# Patient Record
Sex: Female | Born: 1950 | Race: White | Hispanic: No | Smoking: Never smoker
Health system: Southern US, Community
[De-identification: ages and names within clinical notes are randomized; demographics above are authoritative.]

## PROBLEM LIST (undated history)

## (undated) DIAGNOSIS — E785 Hyperlipidemia, unspecified: Secondary | ICD-10-CM

## (undated) DIAGNOSIS — Z9484 Stem cells transplant status: Secondary | ICD-10-CM

## (undated) DIAGNOSIS — M654 Radial styloid tenosynovitis [de Quervain]: Secondary | ICD-10-CM

## (undated) DIAGNOSIS — I219 Acute myocardial infarction, unspecified: Secondary | ICD-10-CM

## (undated) DIAGNOSIS — K219 Gastro-esophageal reflux disease without esophagitis: Secondary | ICD-10-CM

## (undated) DIAGNOSIS — I2510 Atherosclerotic heart disease of native coronary artery without angina pectoris: Secondary | ICD-10-CM

## (undated) DIAGNOSIS — K5790 Diverticulosis of intestine, part unspecified, without perforation or abscess without bleeding: Secondary | ICD-10-CM

## (undated) DIAGNOSIS — Z9481 Bone marrow transplant status: Secondary | ICD-10-CM

## (undated) DIAGNOSIS — C9200 Acute myeloblastic leukemia, not having achieved remission: Secondary | ICD-10-CM

## (undated) DIAGNOSIS — N179 Acute kidney failure, unspecified: Secondary | ICD-10-CM

## (undated) DIAGNOSIS — F32A Depression, unspecified: Secondary | ICD-10-CM

## (undated) HISTORY — PX: TUBAL LIGATION: SHX77

## (undated) HISTORY — DX: Hyperlipidemia, unspecified: E78.5

## (undated) HISTORY — DX: Acute kidney failure, unspecified: N17.9

## (undated) HISTORY — DX: Diverticulosis of intestine, part unspecified, without perforation or abscess without bleeding: K57.90

## (undated) HISTORY — DX: Depression, unspecified: F32.A

## (undated) HISTORY — DX: Acute myeloblastic leukemia, not having achieved remission: C92.00

## (undated) HISTORY — PX: CARDIAC SURGERY: SHX584

## (undated) HISTORY — DX: Acute myocardial infarction, unspecified: I21.9

## (undated) HISTORY — DX: Gastro-esophageal reflux disease without esophagitis: K21.9

## (undated) HISTORY — DX: Stem cells transplant status: Z94.84

## (undated) HISTORY — DX: Atherosclerotic heart disease of native coronary artery without angina pectoris: I25.10

## (undated) HISTORY — DX: Radial styloid tenosynovitis (de quervain): M65.4

## (undated) HISTORY — PX: EYE SURGERY: SHX253

## (undated) HISTORY — DX: Bone marrow transplant status: Z94.81

---

## 2017-11-10 MED ORDER — CITALOPRAM HYDROBROMIDE 20 MG PO TABS
20.00 | ORAL_TABLET | ORAL | Status: DC
Start: 2017-11-09 — End: 2017-11-10

## 2017-11-10 MED ORDER — ACETAMINOPHEN 325 MG PO TABS
650.00 | ORAL_TABLET | ORAL | Status: DC
Start: ? — End: 2017-11-10

## 2017-11-10 MED ORDER — FLUTICASONE PROPIONATE 50 MCG/ACT NA SUSP
2.00 | NASAL | Status: DC
Start: 2017-11-09 — End: 2017-11-10

## 2017-11-10 MED ORDER — PANTOPRAZOLE SODIUM 40 MG PO TBEC
40.00 | DELAYED_RELEASE_TABLET | ORAL | Status: DC
Start: 2017-11-09 — End: 2017-11-10

## 2017-11-10 MED ORDER — LISINOPRIL 5 MG PO TABS
5.00 | ORAL_TABLET | ORAL | Status: DC
Start: 2017-11-09 — End: 2017-11-10

## 2017-11-10 MED ORDER — FEXOFENADINE HCL 180 MG PO TABS
180.00 | ORAL_TABLET | ORAL | Status: DC
Start: 2017-11-09 — End: 2017-11-10

## 2017-11-10 MED ORDER — GENERIC EXTERNAL MEDICATION
12.50 | Status: DC
Start: 2017-11-08 — End: 2017-11-10

## 2017-11-10 MED ORDER — ASPIRIN EC 81 MG PO TBEC
81.00 | DELAYED_RELEASE_TABLET | ORAL | Status: DC
Start: 2017-11-09 — End: 2017-11-10

## 2017-11-10 MED ORDER — MELATONIN 3 MG PO TABS
3.00 | ORAL_TABLET | ORAL | Status: DC
Start: 2017-11-08 — End: 2017-11-10

## 2017-11-10 MED ORDER — LIDOCAINE HCL (PF) 1 % IJ SOLN
0.50 | INTRAMUSCULAR | Status: DC
Start: ? — End: 2017-11-10

## 2017-11-10 MED ORDER — ATORVASTATIN CALCIUM 20 MG PO TABS
20.00 | ORAL_TABLET | ORAL | Status: DC
Start: 2017-11-08 — End: 2017-11-10

## 2017-11-10 MED ORDER — PRASUGREL HCL 10 MG PO TABS
10.00 | ORAL_TABLET | ORAL | Status: DC
Start: 2017-11-09 — End: 2017-11-10

## 2019-03-04 ENCOUNTER — Other Ambulatory Visit: Payer: Self-pay | Admitting: Family Medicine

## 2019-03-04 DIAGNOSIS — Z1231 Encounter for screening mammogram for malignant neoplasm of breast: Secondary | ICD-10-CM

## 2019-03-28 ENCOUNTER — Other Ambulatory Visit: Payer: Self-pay

## 2019-03-28 ENCOUNTER — Ambulatory Visit
Admission: RE | Admit: 2019-03-28 | Discharge: 2019-03-28 | Disposition: A | Discharge status: Home / Self Care | Payer: Medicare Other | Source: Ambulatory Visit | Attending: Family Medicine | Admitting: Family Medicine

## 2019-03-28 DIAGNOSIS — Z1231 Encounter for screening mammogram for malignant neoplasm of breast: Secondary | ICD-10-CM | POA: Insufficient documentation

## 2020-05-07 ENCOUNTER — Other Ambulatory Visit: Payer: Self-pay

## 2020-05-07 DIAGNOSIS — Z1231 Encounter for screening mammogram for malignant neoplasm of breast: Secondary | ICD-10-CM

## 2020-05-25 ENCOUNTER — Encounter (INDEPENDENT_AMBULATORY_CARE_PROVIDER_SITE_OTHER): Payer: Self-pay

## 2020-05-25 ENCOUNTER — Other Ambulatory Visit: Payer: Self-pay

## 2020-05-25 ENCOUNTER — Ambulatory Visit
Admission: RE | Admit: 2020-05-25 | Discharge: 2020-05-25 | Disposition: A | Discharge status: Home / Self Care | Payer: Medicare Other | Source: Ambulatory Visit

## 2020-05-25 DIAGNOSIS — Z1231 Encounter for screening mammogram for malignant neoplasm of breast: Secondary | ICD-10-CM | POA: Diagnosis present

## 2021-01-14 ENCOUNTER — Other Ambulatory Visit: Payer: Self-pay | Admitting: Pediatrics

## 2021-01-14 DIAGNOSIS — Z1231 Encounter for screening mammogram for malignant neoplasm of breast: Secondary | ICD-10-CM

## 2021-01-14 DIAGNOSIS — Z780 Asymptomatic menopausal state: Secondary | ICD-10-CM

## 2021-05-26 ENCOUNTER — Ambulatory Visit
Admission: RE | Admit: 2021-05-26 | Discharge: 2021-05-26 | Disposition: A | Discharge status: Home / Self Care | Payer: Medicare Other | Source: Ambulatory Visit | Attending: Pediatrics | Admitting: Pediatrics

## 2021-05-26 ENCOUNTER — Other Ambulatory Visit: Payer: Self-pay

## 2021-05-26 DIAGNOSIS — Z1231 Encounter for screening mammogram for malignant neoplasm of breast: Secondary | ICD-10-CM | POA: Insufficient documentation

## 2022-05-20 ENCOUNTER — Ambulatory Visit
Admission: EM | Admit: 2022-05-20 | Discharge: 2022-05-20 | Disposition: A | Discharge status: Home / Self Care | Payer: Medicare Other | Attending: Emergency Medicine | Admitting: Emergency Medicine

## 2022-05-20 ENCOUNTER — Encounter: Payer: Self-pay | Admitting: Emergency Medicine

## 2022-05-20 DIAGNOSIS — R3915 Urgency of urination: Secondary | ICD-10-CM | POA: Insufficient documentation

## 2022-05-20 LAB — URINALYSIS, ROUTINE W REFLEX MICROSCOPIC
Bilirubin Urine: NEGATIVE
Glucose, UA: 500 mg/dL — AB
Hgb urine dipstick: NEGATIVE
Ketones, ur: NEGATIVE mg/dL
Leukocytes,Ua: NEGATIVE
Nitrite: NEGATIVE
Protein, ur: 30 mg/dL — AB
Specific Gravity, Urine: 1.01 (ref 1.005–1.030)
pH: 5.5 (ref 5.0–8.0)

## 2022-05-20 LAB — URINALYSIS, MICROSCOPIC (REFLEX)

## 2022-05-20 LAB — GLUCOSE, CAPILLARY: Glucose-Capillary: 132 mg/dL — ABNORMAL HIGH (ref 70–99)

## 2022-05-20 MED ORDER — CEFPODOXIME PROXETIL 200 MG PO TABS: 200.0000 mg | ORAL_TABLET | Freq: Two times a day (BID) | ORAL | 0 refills | Status: AC

## 2022-05-20 NOTE — ED Provider Notes (Signed)
HPI  SUBJECTIVE:  Courtney Thompson is a 71 y.o. female who presents with 2 weeks of urinary urgency, frequency, occasional incontinence primarily at night.  No dysuria,  Cloudy or odorous urine, hematuria, fevers, abdominal, back pain.  She is not sure if she has had any body aches.  She had 1 episode of nonbilious, nonbloody emesis and nausea today immediately after eating.  She has been tolerating fluids since.  No chest pain, pressure, heaviness, nausea, diaphoresis with the nausea/vomiting.  No coughing, wheezing, shortness of breath, lower extremity edema, unintentional weight gain, PND, orthopnea over the past 2 weeks.  Patient took some Zofran and Ativan for the nausea and vomiting with improvement in her symptoms.  No aggravating factors.  Friend and patient states that they contacted her oncologist, who is concerned about a possible UTI, and wants a UA done.  She has had symptoms like this before immediately after her stem cell transplant when she had hemorrhagic cystitis that resolved on its own.  She has a past medical history of leukemia, and is status post stem cell transplant, on Prograf.  She has a history of UTI, elevated creatinine and a STEMI years ago.  No history of diabetes, hypertension, pyelonephritis, nephrolithiasis, congestive heart failure.  PCP: [facility]  Labs obtained from outside records, additional history obtained from friend who accompanies patient today.  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[place])    AKI (acute kidney injury) ([place])    Coronary artery disease    De Quervain's disease (radial styloid tenosynovitis)    Depression    Diverticulosis    GERD (gastroesophageal reflux disease)    H/O bone marrow transplant ([place])    H/O stem cell transplant ([place])    Hyperlipidemia    Myocardial infarct [facility])     Past Surgical History:  Procedure Laterality Date   CARDIAC SURGERY     EYE SURGERY     TUBAL LIGATION      Family History   Problem Relation Age of Onset   Breast cancer Neg Hx     Social History   Tobacco Use   Smoking status: Never   Smokeless tobacco: Never  Vaping Use   Vaping Use: Never used  Substance Use Topics   Alcohol use: Never   Drug use: Never    No current facility-administered medications for this encounter.  Current Outpatient Medications:    acyclovir (ZOVIRAX) 400 MG tablet, Take 400 mg by mouth 2 (two) times daily., Disp: , Rfl:    cefpodoxime (VANTIN) 200 MG tablet, Take 1 tablet (200 mg total) by mouth 2 (two) times daily for 10 days., Disp: 20 tablet, Rfl: 0   cetirizine (ZYRTEC) 10 MG tablet, Take by mouth., Disp: , Rfl:    citalopram (CELEXA) 20 MG tablet, Take by mouth., Disp: , Rfl:    empagliflozin (JARDIANCE) 10 MG TABS tablet, Take 1 tablet by mouth daily., Disp: , Rfl:    famotidine (PEPCID) 20 MG tablet, Take 1 tablet by mouth 2 (two) times daily., Disp: , Rfl:    Letermovir 480 MG TABS, Take by mouth., Disp: , Rfl:    lisinopril (ZESTRIL) 20 MG tablet, Take 20 mg by mouth daily., Disp: , Rfl:    metoprolol succinate (TOPROL-XL) 25 MG 24 hr tablet, Take 1 tablet by mouth daily., Disp: , Rfl:    Multiple Vitamins-Minerals (PRESERVISION AREDS 2) CAPS, Take by mouth., Disp: , Rfl:    ondansetron (ZOFRAN) 8 MG tablet, Take 1  tablet by mouth every 8 (eight) hours as needed., Disp: , Rfl:    posaconazole (NOXAFIL) 100 MG TBEC delayed-release tablet, Take by mouth., Disp: , Rfl:    potassium chloride SA (KLOR-CON M) 20 MEQ tablet, Take by mouth., Disp: , Rfl:    predniSONE (DELTASONE) 10 MG tablet, Take 40 mg by mouth daily., Disp: , Rfl:    ruxolitinib phosphate (JAKAFI) 5 MG tablet, Take 1 tablet by mouth 2 (two) times daily., Disp: , Rfl:    tacrolimus (PROGRAF) 0.5 MG capsule, Take by mouth., Disp: , Rfl:    traMADol (ULTRAM) 50 MG tablet, Take 50 mg by mouth every 6 (six) hours as needed., Disp: , Rfl:   Allergies  Allergen Reactions   Chlorhexidine Itching    Chlorhexidine Gluconate Itching and Rash   Statins Other (See Comments)    Muscle pain. Tolerates well in low doses     ROS  As noted in HPI.   Physical Exam  BP 110/73 (BP Location: Left Arm)   Pulse 82   Temp 97.8 F (36.6 C) (Oral)   Resp 14   Ht '4\' 11"'$  (1.499 m)   Wt 52.6 kg   SpO2 98%   BMI 23.43 kg/m   Constitutional: Well developed, well nourished, no acute distress Eyes:  EOMI, conjunctiva normal bilaterally HENT: Normocephalic, atraumatic,mucus membranes moist Respiratory: Normal inspiratory effort, lungs clear bilaterally. Cardiovascular: Normal rate, regular rhythm, no murmurs, rubs, gallops GI: nondistended soft, nontender, active bowel sounds.  No rebound, guarding Back: No CVAT skin: No rash, skin intact Musculoskeletal: No lower extremity edema Neurologic: Alert & oriented x 3, no focal neuro deficits Psychiatric: Speech and behavior appropriate   ED Course   Medications - No data to display  Orders Placed This Encounter  Procedures   Urine Culture    Standing Status:   Standing    Number of Occurrences:   1    Order Specific Question:   Indication    Answer:   Urgency/frequency   Urinalysis, Routine w reflex microscopic Urine, Clean Catch    Standing Status:   Standing    Number of Occurrences:   1   Urinalysis, Microscopic (reflex)    Standing Status:   Standing    Number of Occurrences:   1   Glucose, capillary    Standing Status:   Standing    Number of Occurrences:   1    Results for orders placed or performed during the hospital encounter of 05/20/22 (from the past 24 hour(s))  Urinalysis, Routine w reflex microscopic Urine, Clean Catch     Status: Abnormal   Collection Time: 05/20/22  4:31 PM  Result Value Ref Range   Color, Urine YELLOW YELLOW   APPearance CLEAR CLEAR   Specific Gravity, Urine 1.010 1.005 - 1.030   pH 5.5 5.0 - 8.0   Glucose, UA 500 (A) NEGATIVE mg/dL   Hgb urine dipstick NEGATIVE NEGATIVE   Bilirubin Urine  NEGATIVE NEGATIVE   Ketones, ur NEGATIVE NEGATIVE mg/dL   Protein, ur 30 (A) NEGATIVE mg/dL   Nitrite NEGATIVE NEGATIVE   Leukocytes,Ua NEGATIVE NEGATIVE  Urinalysis, Microscopic (reflex)     Status: Abnormal   Collection Time: 05/20/22  4:31 PM  Result Value Ref Range   RBC / HPF 0-5 0 - 5 RBC/hpf   WBC, UA 6-10 0 - 5 WBC/hpf   Bacteria, UA FEW (A) NONE SEEN   Squamous Epithelial / LPF 6-10 0 - 5   Hyaline Casts, UA  PRESENT   Glucose, capillary     Status: Abnormal   Collection Time: 05/20/22  5:12 PM  Result Value Ref Range   Glucose-Capillary 132 (H) 70 - 99 mg/dL   No results found.  ED Clinical Impression  1. Urinary urgency      ED Assessment/Plan     Outside labs from last week reviewed.  Calculated creatinine clearance 31 mL/min.  Do not need to renally dose cefpodoxime per up-to-date.  Friend and patient states that their PCP's primary concern is the possibility of a urinary tract infection.  There is no evidence of CHF.  Discussed with them that the urine is fairly nondiagnostic, however, given how immunocompromised she is, I am going to send this off for culture and start her on an antibiotic today.  We will treat as a complicated urinary tract infection with cefpodoxime 200 mg twice daily for 10 days.  She has follow-up with her PCP on Monday.  They will follow-up on the urine culture.  Strict ER return precautions given.  Patient and friend are agreeable with this plan.  Discussed labs,  MDM, treatment plan, and plan for follow-up with patient. Discussed sn/sx that should prompt return to the ED. patient agrees with plan.   Meds ordered this encounter  Medications   cefpodoxime (VANTIN) 200 MG tablet    Sig: Take 1 tablet (200 mg total) by mouth 2 (two) times daily for 10 days.    Dispense:  20 tablet    Refill:  0      *This clinic note was created using [employer]. Therefore, there may be occasional mistakes despite careful  proofreading.  ?    Melynda Ripple, MD 05/21/22 [phone]

## 2022-05-20 NOTE — Discharge Instructions (Addendum)
I have sent your urine off for culture, however, I am starting you on an antibiotic today to cover any possible urinary tract infection.  Please follow-up with your doctor as scheduled on Monday.  Go immediately to the ER if you start vomiting again, have fevers, body aches, feel generally weak, or for any other concerns.

## 2022-05-20 NOTE — ED Triage Notes (Signed)
Patient c/o urinary frequency that started at night for couple of weeks.  Patient denies any dysuria.  Patient denies fevers.

## 2022-05-23 ENCOUNTER — Telehealth (HOSPITAL_COMMUNITY): Payer: Self-pay | Admitting: Emergency Medicine

## 2022-05-23 LAB — URINE CULTURE: Culture: 30000 — AB

## 2022-05-23 MED ORDER — AMOXICILLIN 500 MG PO CAPS: 500.0000 mg | ORAL_CAPSULE | Freq: Three times a day (TID) | ORAL | 0 refills | Status: AC

## 2022-07-12 ENCOUNTER — Other Ambulatory Visit: Payer: Self-pay | Admitting: Hematology and Oncology

## 2022-07-12 DIAGNOSIS — Z1231 Encounter for screening mammogram for malignant neoplasm of breast: Secondary | ICD-10-CM

## 2022-07-20 ENCOUNTER — Ambulatory Visit: Payer: Medicare Other

## 2022-08-01 ENCOUNTER — Ambulatory Visit: Payer: Medicare Other

## 2023-01-16 IMAGING — MG MM DIGITAL SCREENING BILAT W/ TOMO AND CAD
8 series · 8 of 24 positions shown · non-contrast
Comparison: Previous exam(s).

CLINICAL DATA: Screening.

EXAM:
DIGITAL SCREENING BILATERAL MAMMOGRAM WITH TOMOSYNTHESIS AND CAD
TECHNIQUE: Bilateral screening digital craniocaudal and mediolateral oblique
mammograms were obtained. Bilateral screening digital breast
tomosynthesis was performed. The images were evaluated with
computer-aided detection.

[L CC synth-2D]
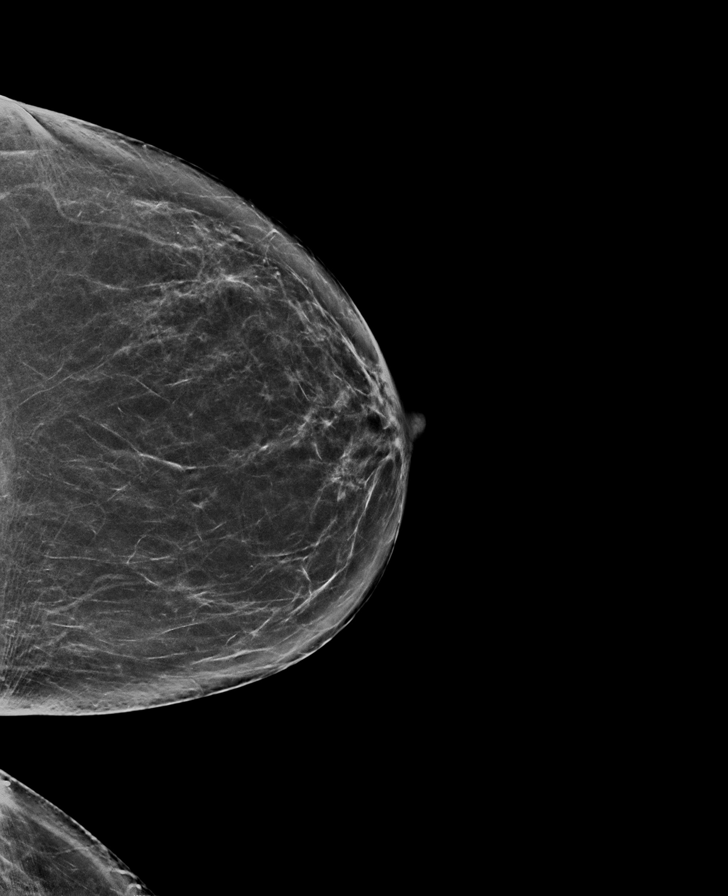

[R CC synth-2D]
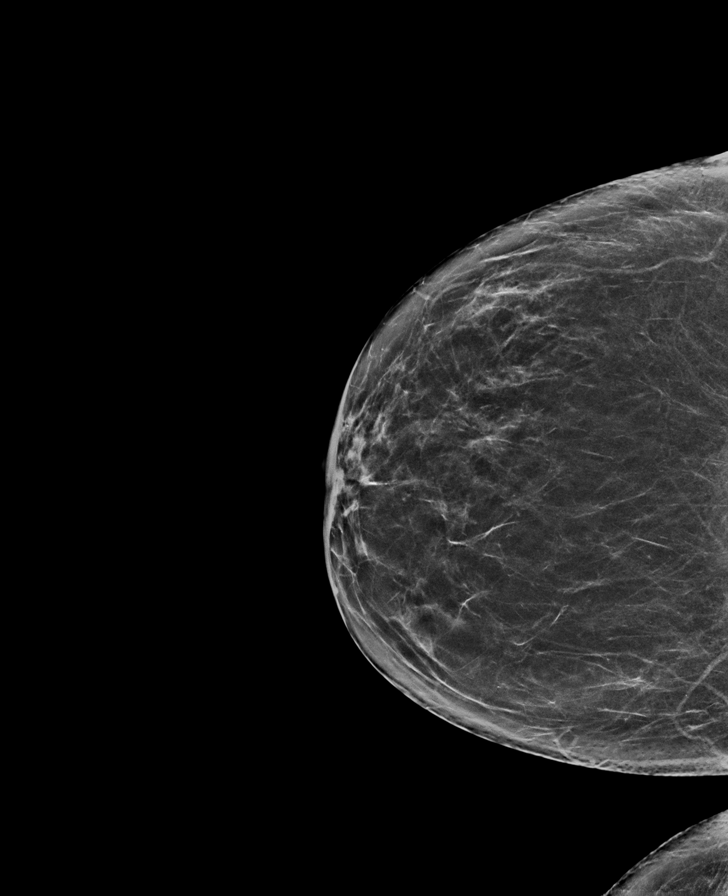

[R MLO synth-2D]
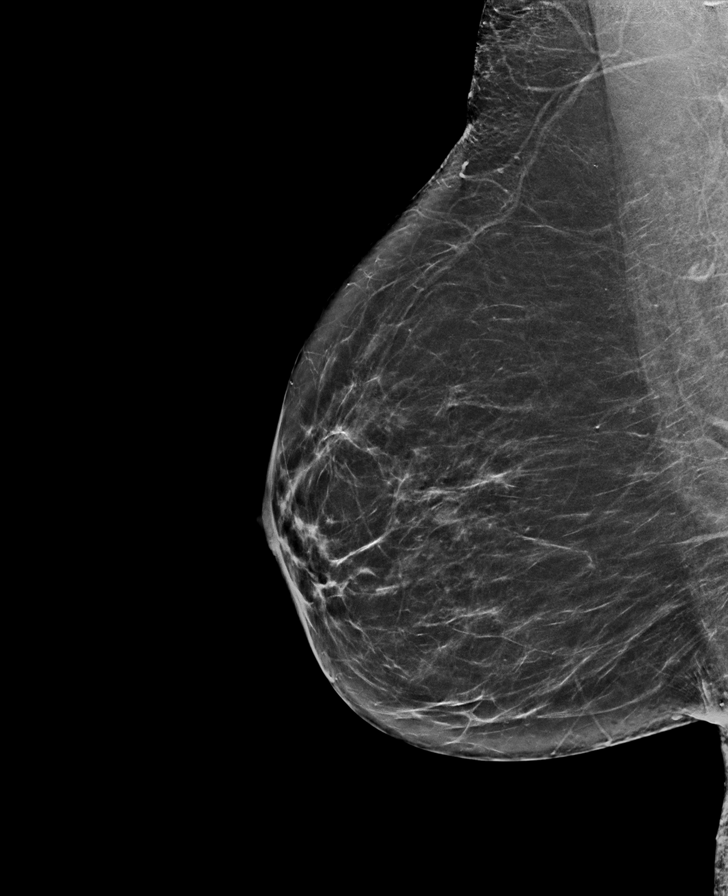

[L MLO synth-2D]
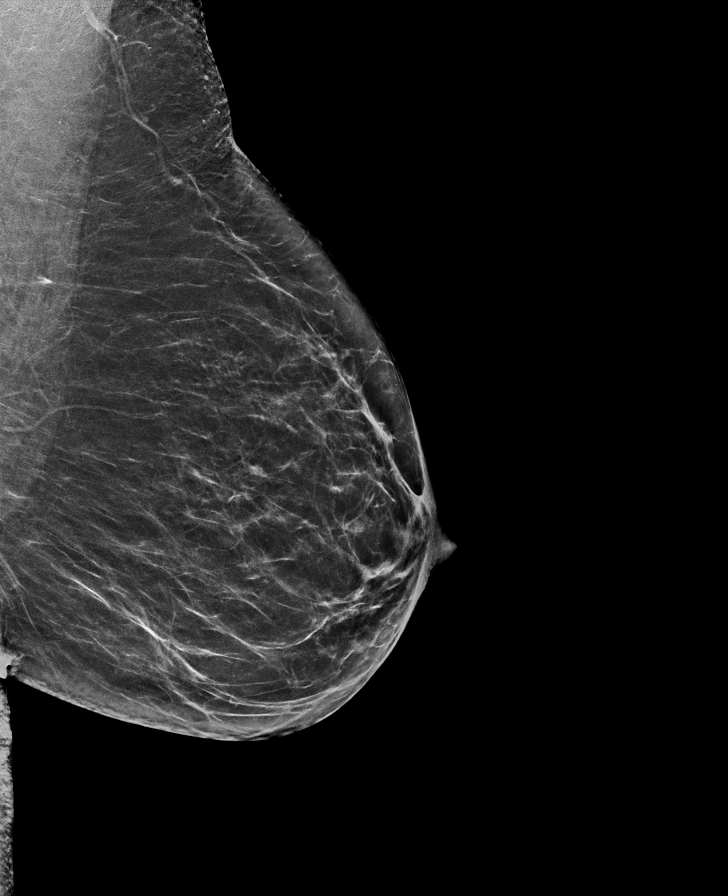

[R MLO tomo · tomo slice 35/69.0]
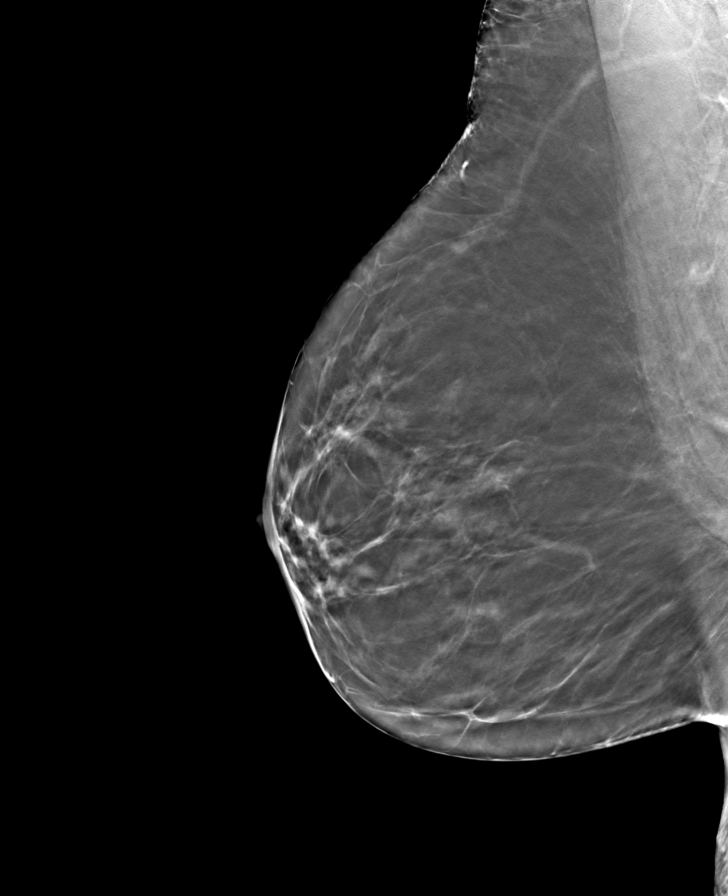

[L CC tomo · tomo slice 35/68.0]
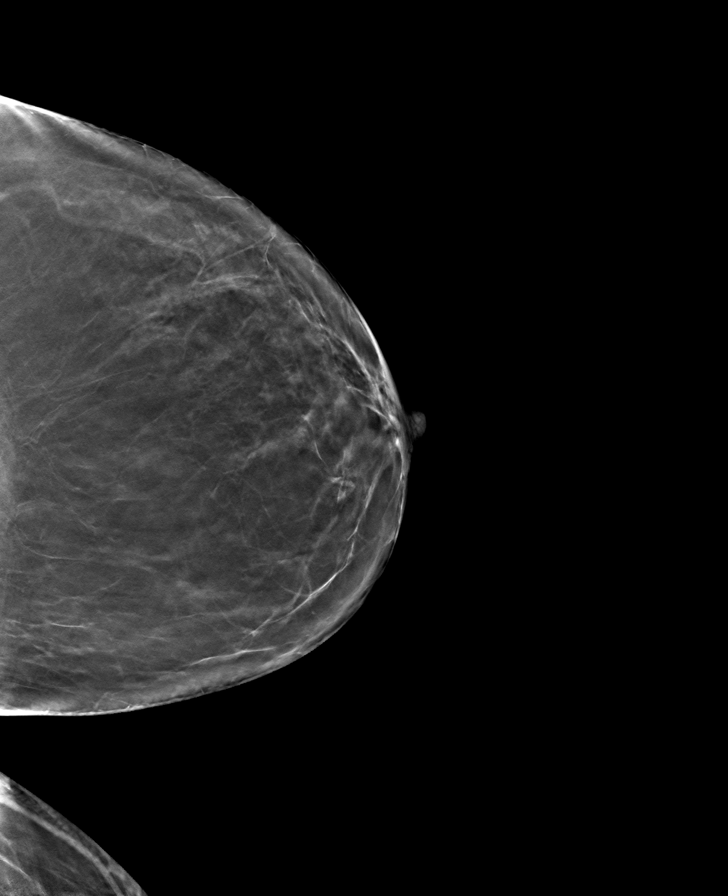

[R CC tomo · tomo slice 33/66.0]
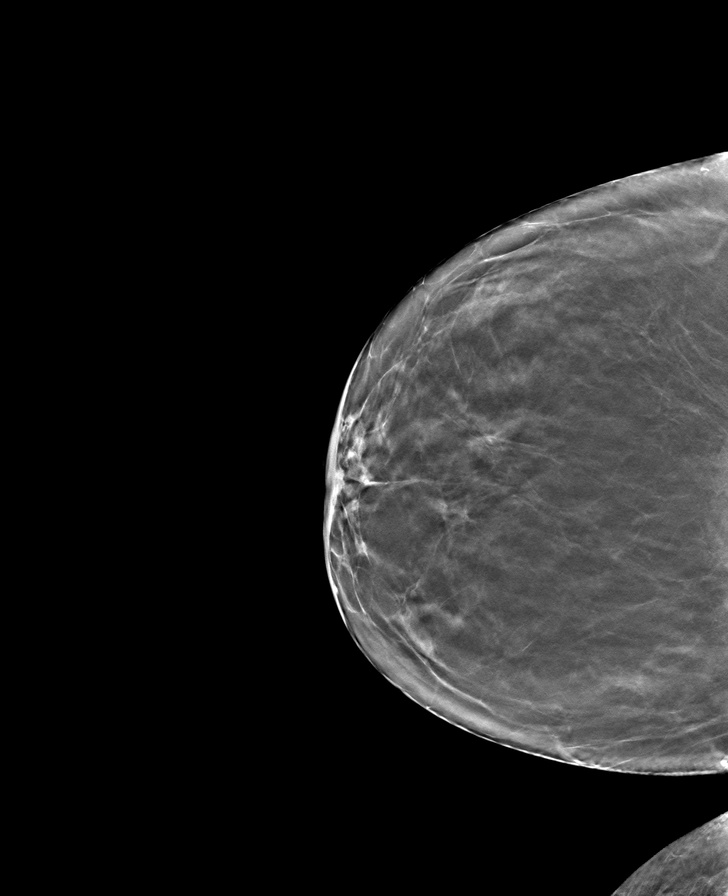

[L MLO tomo · tomo slice 35/68.0]
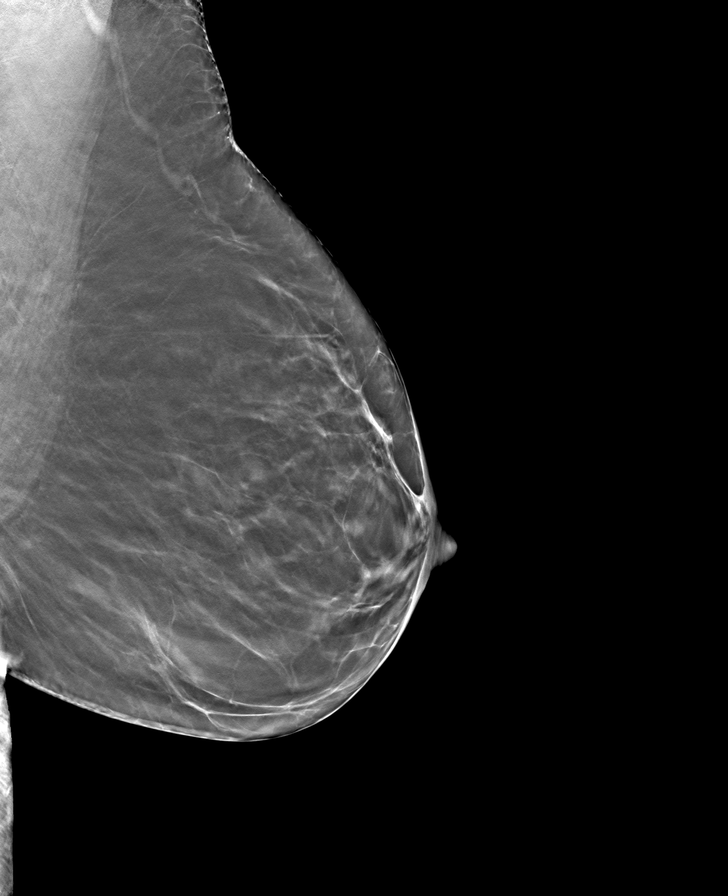

[8 of 24 positions shown; findings below may reference images not displayed]

ACR Breast Density Category b: There are scattered areas of
fibroglandular density.
FINDINGS: There are no findings suspicious for malignancy.
IMPRESSION: No mammographic evidence of malignancy. A result letter of this
screening mammogram will be mailed directly to the patient.

RECOMMENDATION:
Screening mammogram in one year. (Code:51-O-LD2)

BI-RADS CATEGORY  1: Negative.
